# Patient Record
Sex: Male | Born: 1950 | Race: White | Hispanic: No | Marital: Married
Health system: Southern US, Community
[De-identification: ages and names within clinical notes are randomized; demographics above are authoritative.]

---

## 2005-08-24 ENCOUNTER — Ambulatory Visit: Payer: Self-pay | Admitting: Cardiology

## 2006-01-31 ENCOUNTER — Ambulatory Visit: Payer: Self-pay | Admitting: Unknown Physician Specialty

## 2008-03-31 ENCOUNTER — Ambulatory Visit: Payer: Self-pay | Admitting: Internal Medicine

## 2008-05-07 ENCOUNTER — Ambulatory Visit: Payer: Self-pay | Admitting: Internal Medicine

## 2008-09-10 ENCOUNTER — Ambulatory Visit: Payer: Self-pay | Admitting: Unknown Physician Specialty

## 2008-09-30 ENCOUNTER — Emergency Department: Payer: Self-pay | Admitting: Internal Medicine

## 2008-10-28 ENCOUNTER — Encounter: Payer: Self-pay | Admitting: Neurosurgery

## 2008-11-19 ENCOUNTER — Encounter: Payer: Self-pay | Admitting: Neurosurgery

## 2012-05-02 ENCOUNTER — Ambulatory Visit: Payer: Self-pay | Admitting: Radiation Oncology

## 2012-05-21 ENCOUNTER — Ambulatory Visit: Payer: Self-pay | Admitting: Radiation Oncology

## 2012-07-20 ENCOUNTER — Emergency Department: Payer: Self-pay | Admitting: Emergency Medicine

## 2013-08-25 IMAGING — CR DG RIBS 2V*L*
1 series · 4 of 4 positions shown · non-contrast
Comparison: none

REASON FOR EXAM: left sided rib pain
COMMENTS:

[Series 1: w ribs ap upper left · 0.14mm/px · 4 of 4 slices shown]
[im 1/4]
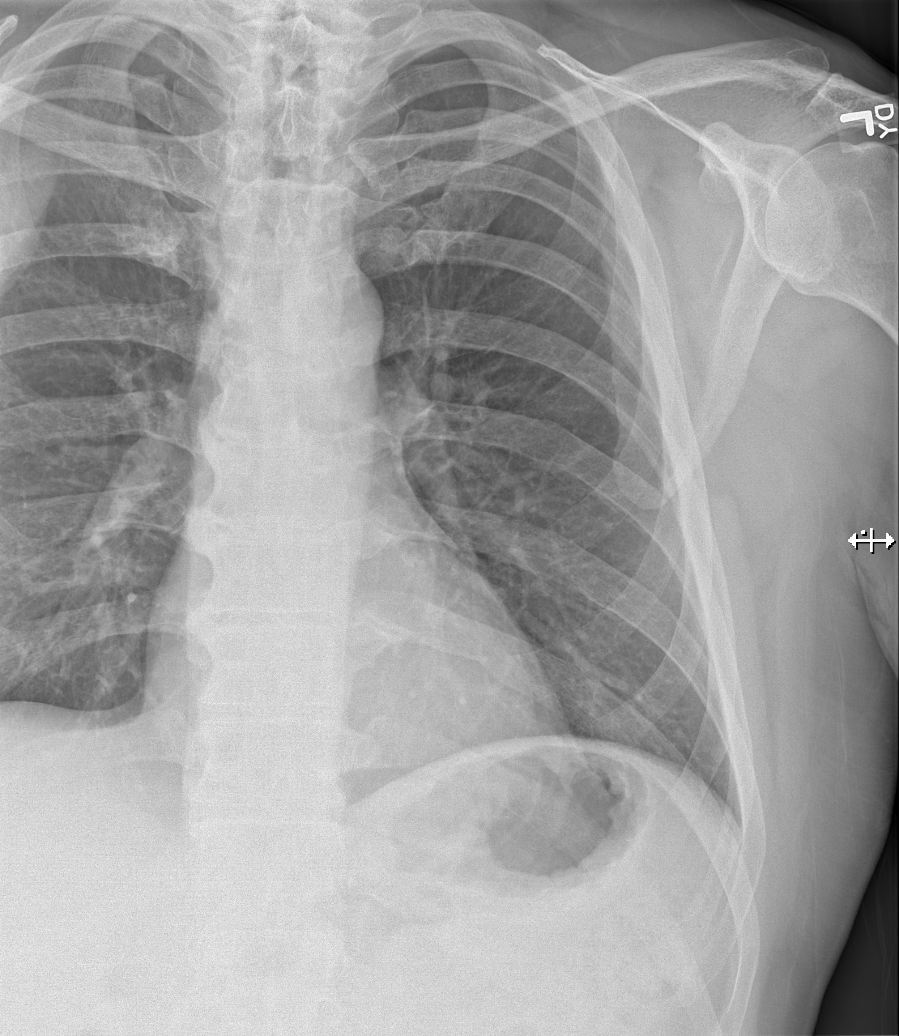
[im 2/4]
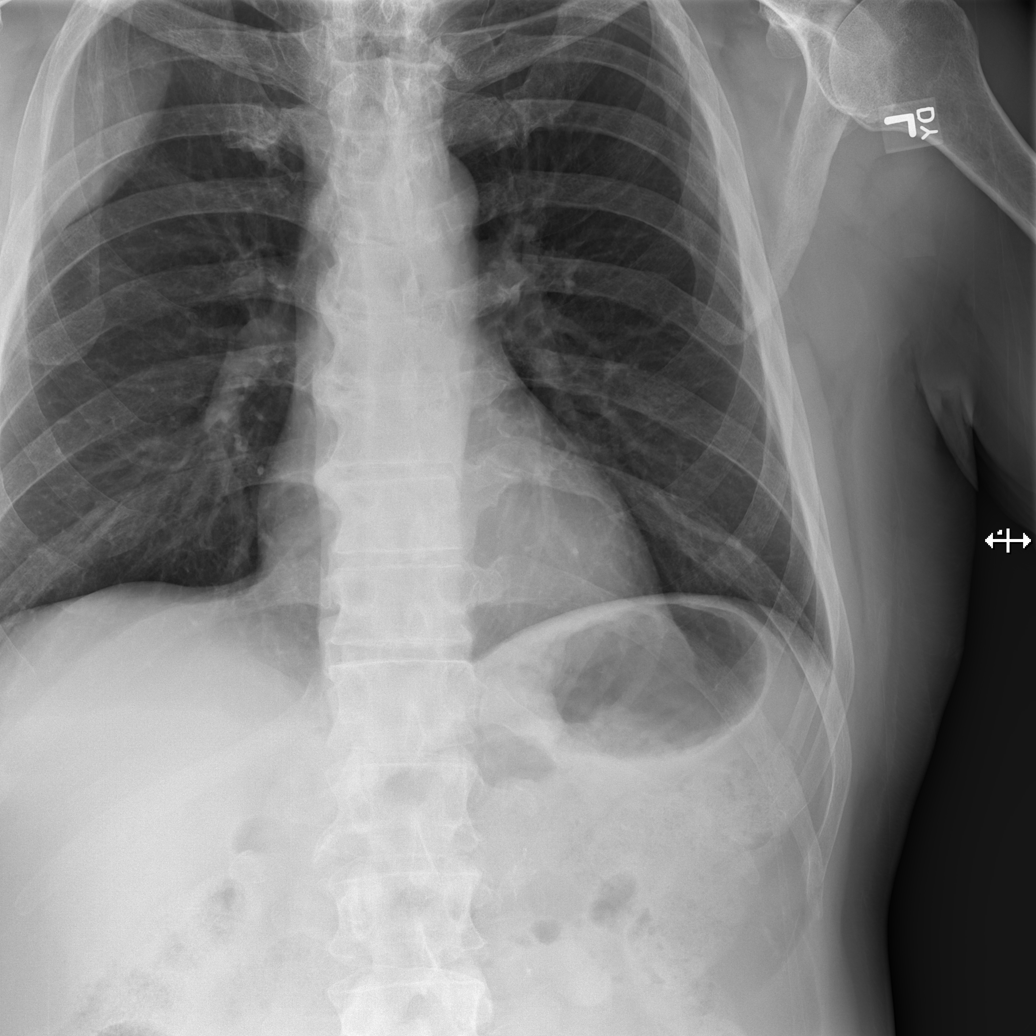
[im 3/4]
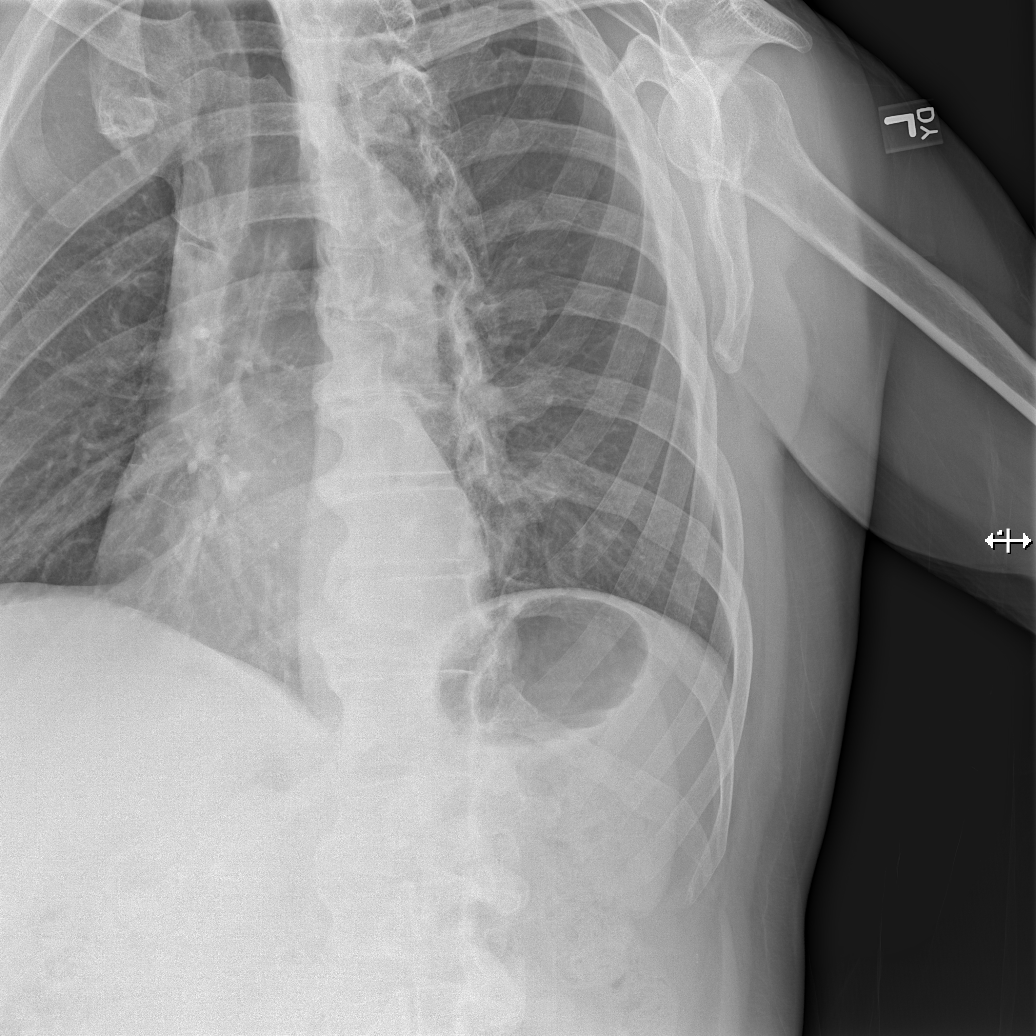
[im 4/4]
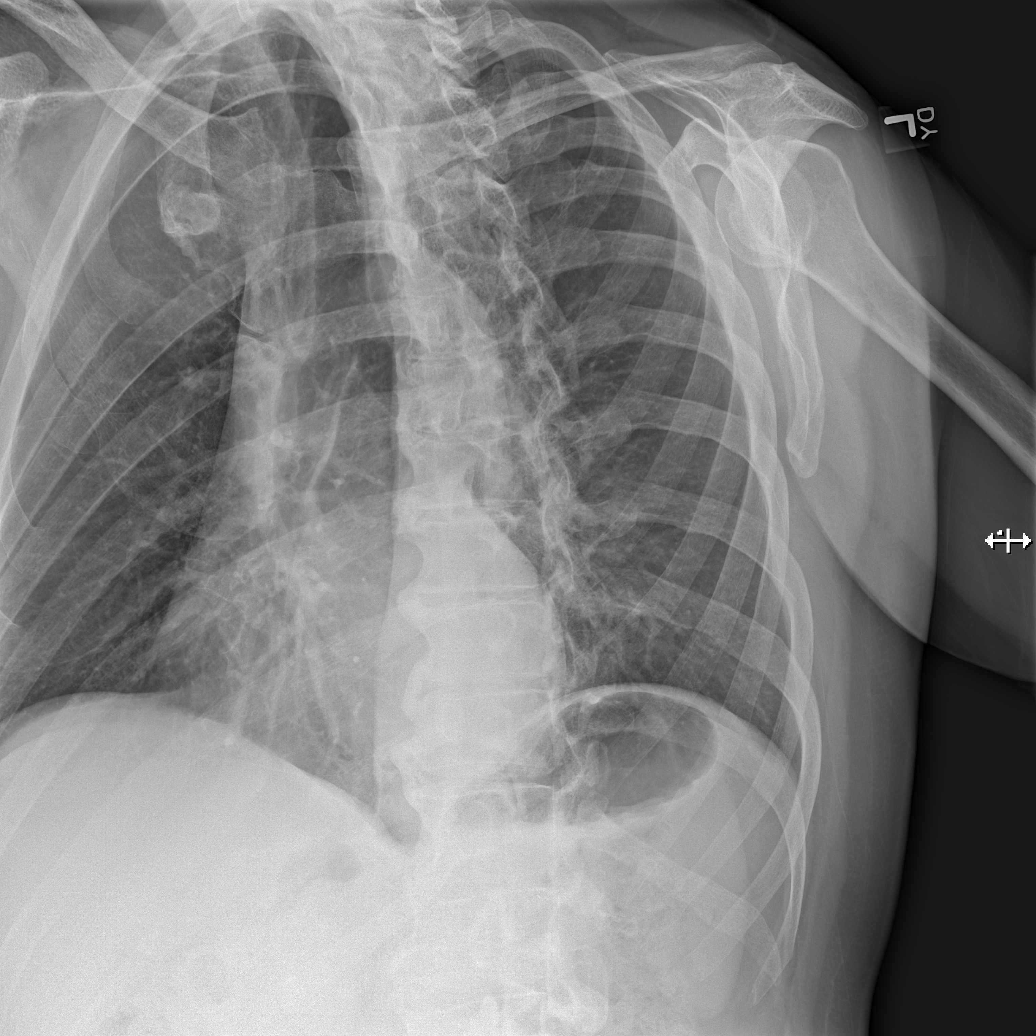

[4 of 4 positions shown; findings below may reference images not displayed]

PROCEDURE:     DXR - DXR RIBS LEFT UNILATERAL  - July 20, 2012 [DATE]

RESULT:     Four views of the left rib cage reveal the bones to be
adequately mineralized for age. There is no evidence of an acute displaced
left rib fracture. There is subtle contour deformity of the posterior aspect
of the left eighth rib, but a definite fracture line is not demonstrated.
There is no pleural effusion nor alveolar infiltrate. The patient has a
known destructive process associated with the posterior aspect of the right
fifth rib which is partially included in the field-of-view.
IMPRESSION: There is no definite evidence of an acute displaced rib
fracture on the left.

[REDACTED]

## 2013-08-25 IMAGING — CR DG CHEST 2V
1 series · 2 of 2 positions shown · non-contrast
Comparison: none

REASON FOR EXAM: chest pain, trauma
COMMENTS:

[Series 1: w chest pa · 0.14mm/px · 2 of 2 slices shown]
[im 1/2]
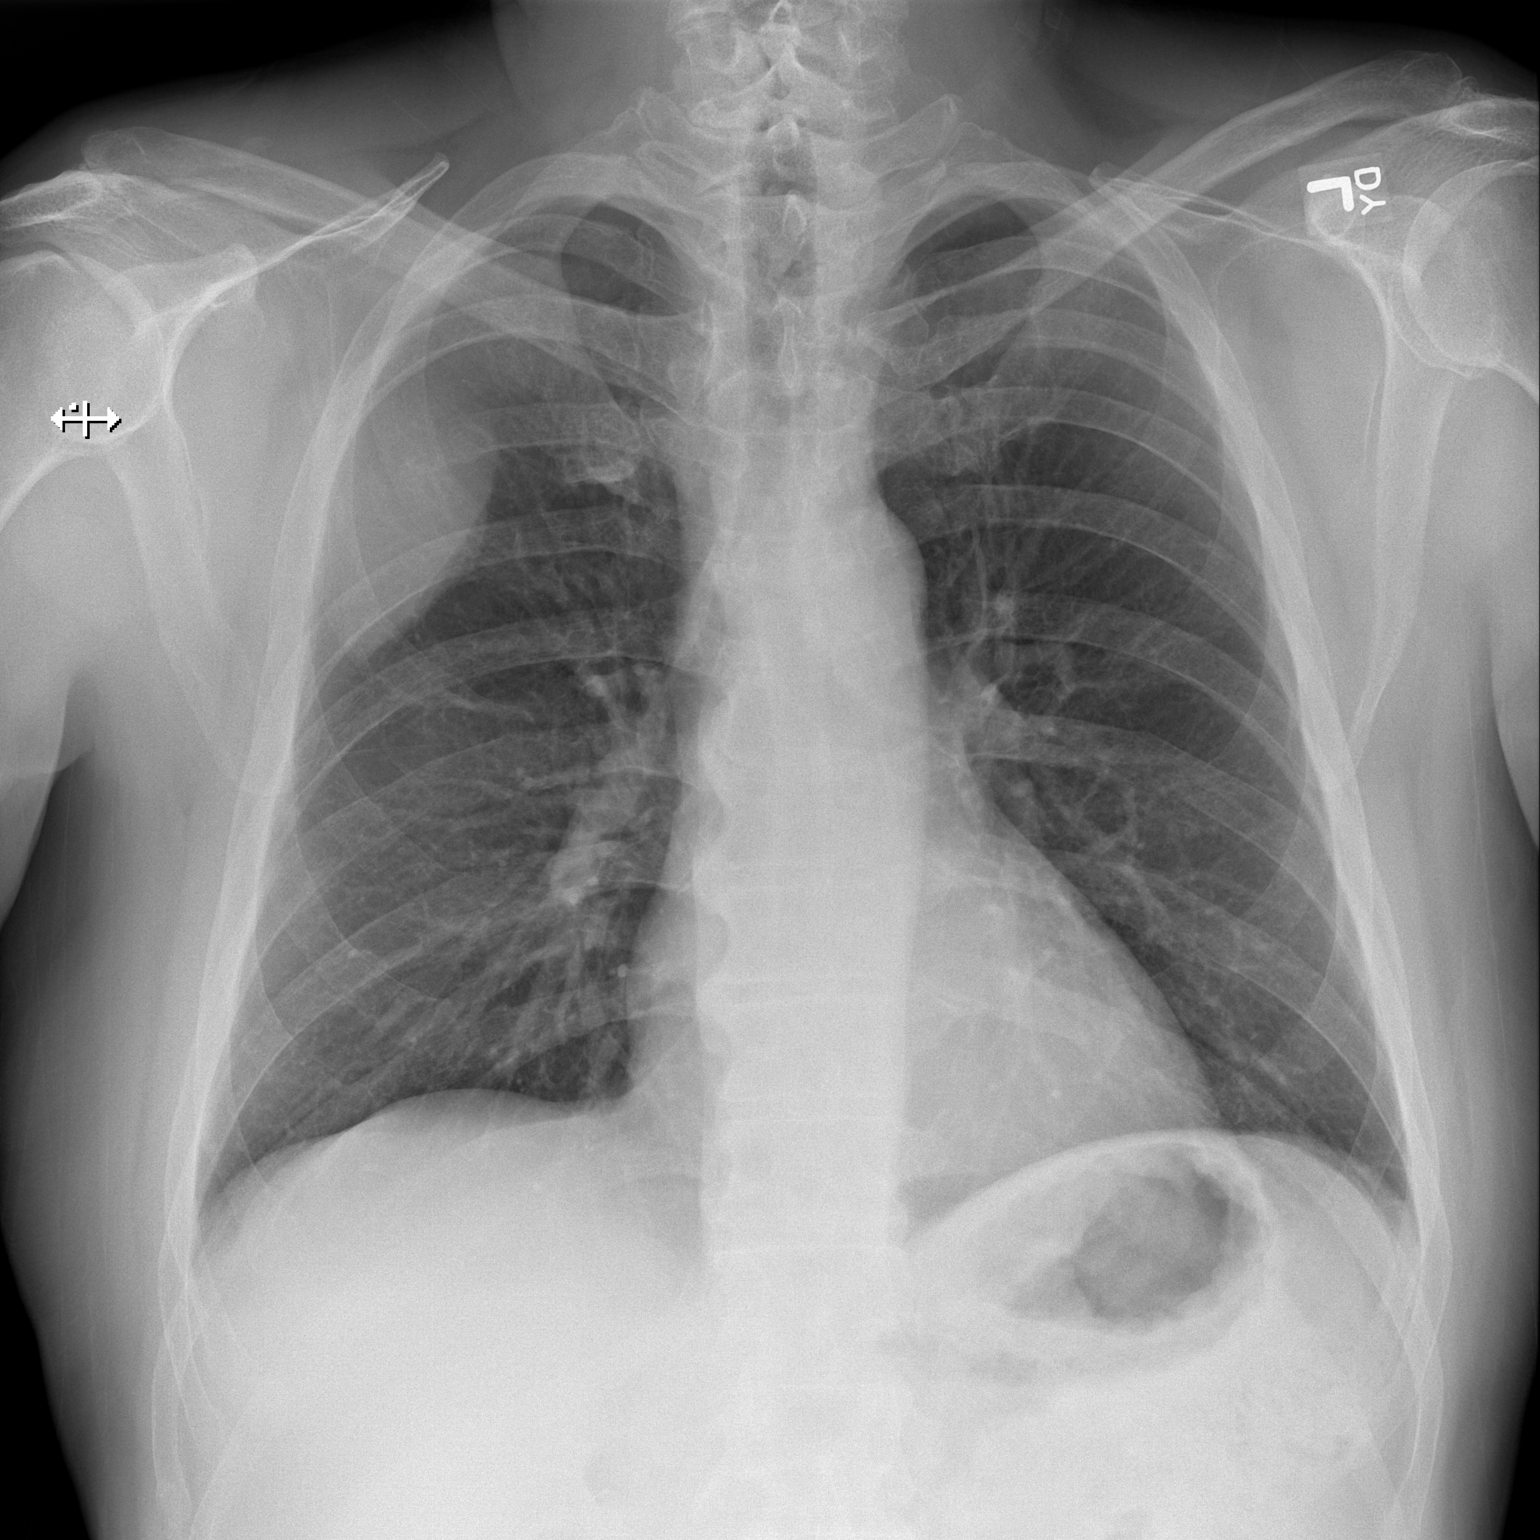
[im 2/2]
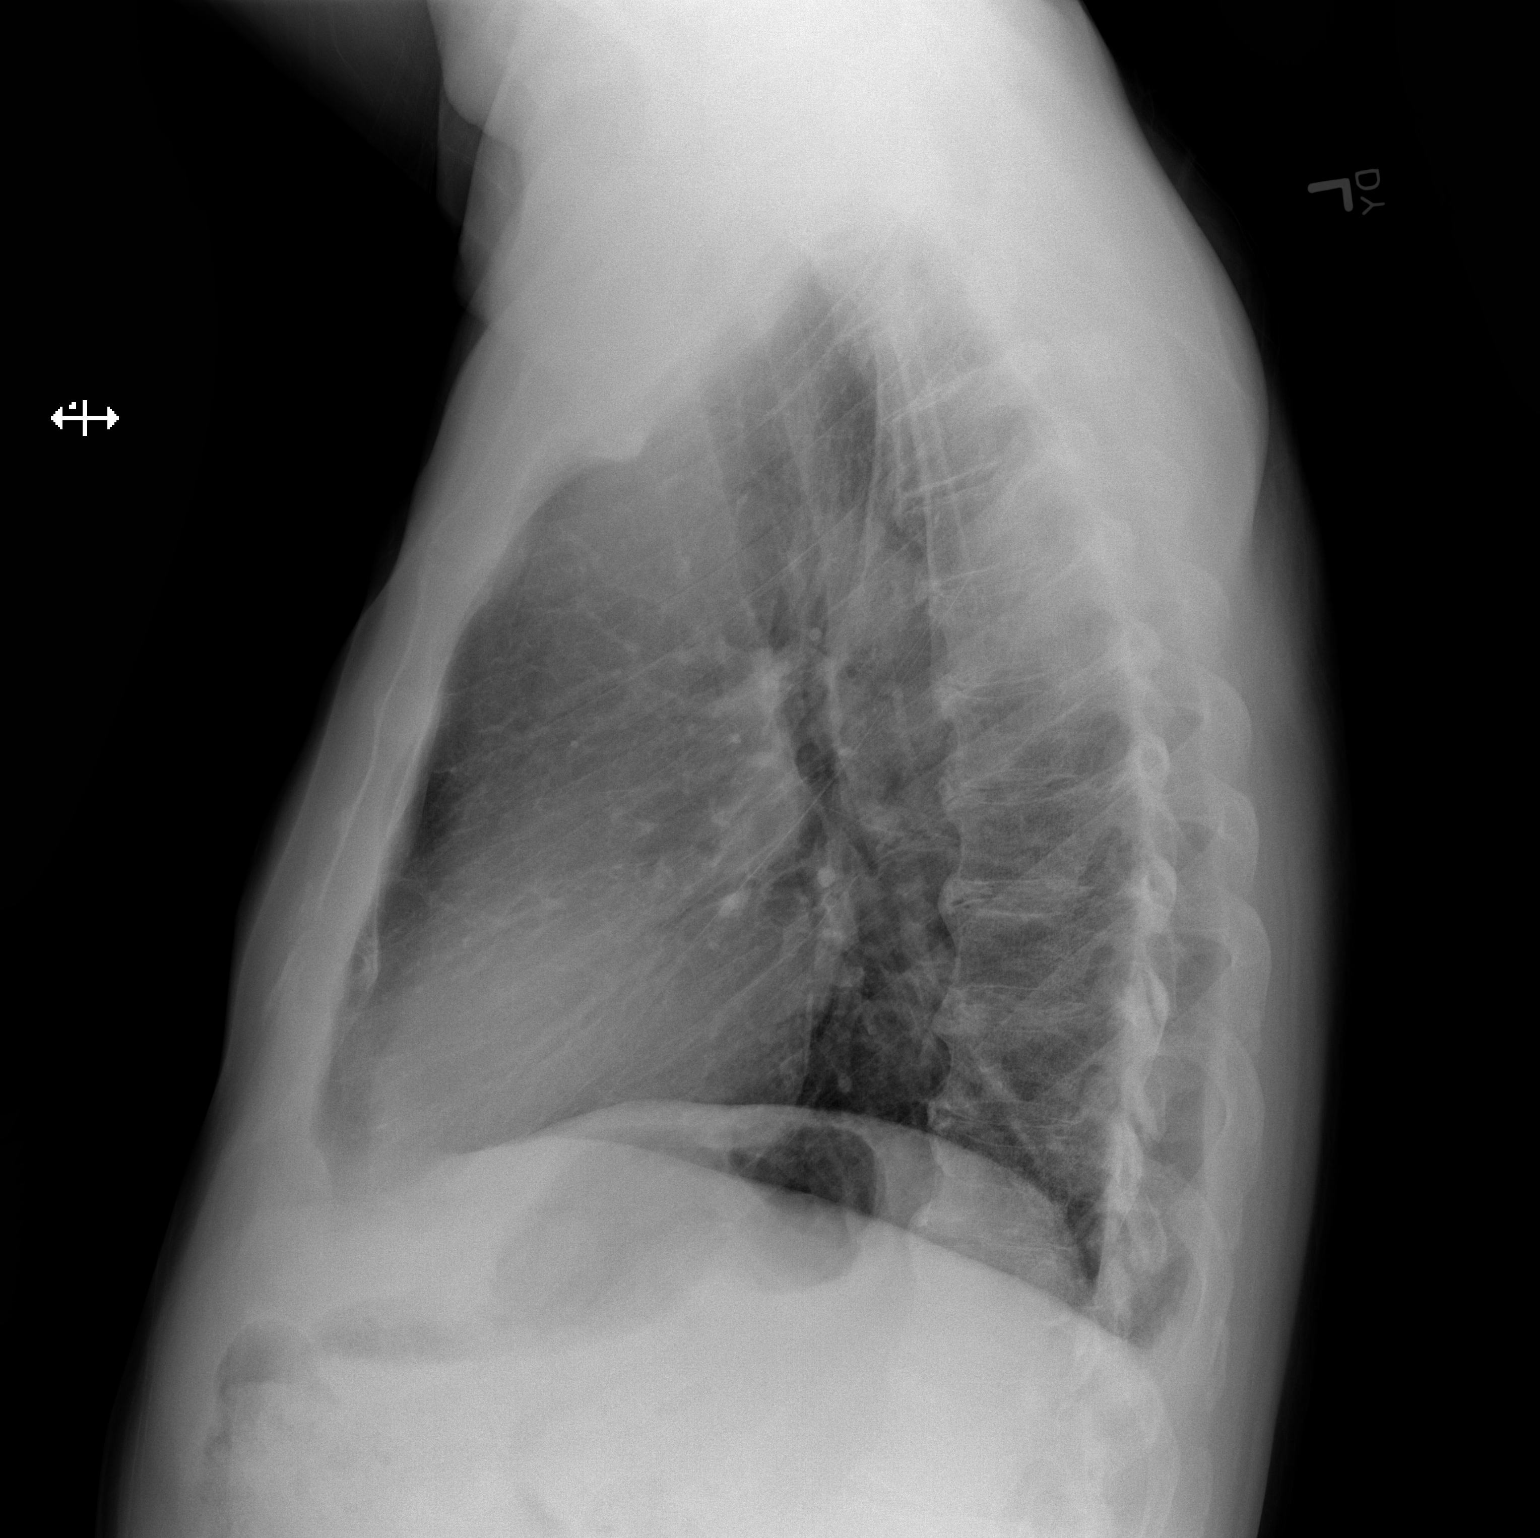

[2 of 2 positions shown; findings below may reference images not displayed]

PROCEDURE:     DXR - DXR CHEST PA (OR AP) AND LATERAL  - July 20, 2012 [DATE]

RESULT:     The lungs are well-expanded. There is a pleural-based soft
tissue density posterolaterally in the right upper hemithorax. There is
destruction of the lateral aspect of the right fifth rib. The mediastinum is
not shifted and is normal in width. The cardiac silhouette is normal in
size. There is no pleural effusion or pneumothorax or pneumomediastinum.
IMPRESSION: 1. There is no evidence of acute posttraumatic injury.
2. There is a mass in the right upper hemithorax posterolaterally.
Destruction of the postero lateral aspect of the right fifth rib is present.

The findings were discussed by me by telephone with Dr. Ptychoverpa at the
conclusion of the study. The patient has a known history of metastatic renal
cell malignancy.

[REDACTED]

## 2014-01-14 ENCOUNTER — Ambulatory Visit: Payer: Self-pay | Admitting: Nephrology

## 2014-12-08 NOTE — Consult Note (Signed)
Reason for Visit: This 64 year old Male patient presents to the clinic for initial evaluation of  Stage IV renal cell carcinoma .   Referred by Dr. Lyndon Code [facility] radiation oncology.  Diagnosis:   Chief Complaint/Diagnosis   64 year old male with stage IV renal cell carcinoma 5 cm lesion in the posterior right 5th rib asymptomatic at this time   Pathology Report Pathology report reviewed    Imaging Report Imaging records requested from [facility]    Referral Report Clinical notes reviewed    Planned Treatment Regimen Evaluation by medical oncology and possible palliative radiation therapy to rib    HPI   patient is a 64 year old male who presented with mental status alterations and was found to have a right lateral ventricle mass status post neurosurgical resection February 2010 for renal cell carcinoma. CT scan in March of 2000 tends showed 2 left adrenal masses. 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 Underwent left sided laparoscopic guided radical nephrectomy April 2010. Repeat followup CT scans back in August of 2013 showed a 5 cm lesion arising from the posterior lateral right 5th rib. This is asymptomatic. He is been read recommended to have radiotherapy on this area and is seen today for consideration of treatment. From a neurologic standpoint he is functionally well no significant impairment. He has not been tried on any targeted therapies at this time. He otherwise specifically denies any other areas of bony pain.fine-needle aspiration of his rib lesion was positive for metastatic renal cell carcinoma.  Past Hx:    Renal Cell Cancer:    Brain Met:    diabetes:    htn:    Left Nephrectomy:    back surgery:   Past, Family and Social History:   Past Medical History positive    Cardiovascular hyperlipidemia; hypertension    Endocrine diabetes mellitus    Past Surgical History Laparoscopic nephrectomy, neurosurgical  resection of brain metastasis, back surgery,    Family History noncontributory    Social History noncontributory    Additional Past Medical and Surgical History Accompanied by wife today   Allergies:   Codeine: GI Distress  Gabapentin: Rash  Home Meds:  Home Medications: Medication Instructions Status  Humalog 100 units/mL subcutaneous solution 30 unit(s) subcutaneous 3 times a day Active  Lantus 100 units/mL subcutaneous solution 60 unit(s) subcutaneous once a day (at bedtime) Active  verapamil 240 mg/24 hours oral capsule, extended release 1 cap(s) orally once a day Active  Zocor 20 mg oral tablet 1 tab(s) orally once a day (at bedtime) Active  aspirin 81 mg oral tablet 1 tab(s) orally once a day Active  cinnamon 500 mg oral capsule 2 cap(s) orally 2 times a day Active  Magnesium 400 mg.  1 tab(s) orally once a day Active  folic acid 0.4 mg oral tablet 1 tab(s) orally once a day Active  Fish Oil 1000 mg oral capsule 1 cap(s) orally 2 times a day Active  Cranberry 500 mg.  1 cap(s) orally once a day Active   Review of Systems:   General negative    Performance Status (ECOG) 0    Breast negative    Ophthalmologic negative    ENMT negative    Respiratory and Thorax negative    Cardiovascular negative    Gastrointestinal negative    Genitourinary see HPI    Musculoskeletal negative    Neurological see HPI    Psychiatric negative    Hematology/Lymphatics negative  Endocrine negative    Review of Systems   aside from prior removal of skin cancerPatient denies any weight loss, fatigue, weakness, fever, chills or night sweats. Patient denies any loss of vision, blurred vision. Patient denies any ringing  of the ears or hearing loss. No irregular heartbeat. Patient denies heart murmur or history of fainting. Patient denies any chest pain or pain radiating to her upper extremities. Patient denies any shortness of breath, difficulty breathing at night, cough or  hemoptysis. Patient denies any swelling in the lower legs. Patient denies any nausea vomiting, vomiting of blood, or coffee ground material in the vomitus. Patient denies any stomach pain. Patient states has had normal bowel movements no significant constipation or diarrhea. Patient denies any dysuria, hematuria or significant nocturia. Patient denies any problems walking, swelling in the joints or loss of balance. Patient denies any skin changes, loss of hair or loss of weight. Patient denies any excessive worrying or anxiety or significant depression. Patient denies any problems with insomnia. Patient denies excessive thirst, polyuria, polydipsia. Patient denies any swollen glands, patient denies easy bruising or easy bleeding. Patient denies any recent infections, allergies or URI. Patient "s visual fields have not changed significantly in recent time.  Nursing Notes:  Nursing Vital Signs and Chemo Nursing Nursing Notes: *CC Vital Signs Flowsheet:   12-Sep-13 08:18   Temp Temperature 97.8   Pulse Pulse 91   Respirations Respirations 20   SBP SBP 130   DBP DBP 71   Pain Scale (0-10)  0   Current Weight (kg) (kg) 85.9   Height (cm) centimeters 187.8   BSA (m2) 2.1   Physical Exam:  General/Skin/HEENT:   General normal    Skin normal    Eyes normal    ENMT normal    Head and Neck normal    Additional PE Well-developed male in NAD. Cranial nerves II through XII are grossly intact. Crude visual fields are within normal range. Motor sensory N. DTR levels are equal and symmetric in the upper and lower extremities. No pain is elicited on palpation of his rib cage bilaterally. Abdomen is benign. Lungs are clear to A&P cardiac examination shows regular rate and rhythm.   Breasts/Resp/CV/GI/GU:   Respiratory and Thorax normal    Cardiovascular normal    Gastrointestinal normal    Genitourinary normal   MS/Neuro/Psych/Lymph:   Musculoskeletal normal    Neurological normal     Lymphatics normal   Assessment and Plan:  Impression:   stage IV renal cell carcinoma with progressive disease in right 5th rib on patient is a 90 if any targeted therapies or chemotherapy intervention.  Plan:   this time I discussed the case with medical oncology. We will leave patient may be a candidate for molecular targeted therapy with eitherVEGF or mTOR inhibitor. This can be used and observe his rib lesion is measurable disease for treatment response. We will have him evaluated by medical oncology early next week for consideration of such therapy. Should he not be a candidate for such therapy would go ahead with palliative radiation therapy to the rib lesion. Would need to review all of his recurrent CT scans for tumor delineation and I have requested them to be forwarded to [place] from [facility] radiology Department. Brief description of risks and benefits of treatment were provided to the patient and his wife. Appointment with medical oncology was made.  I would like to take this opportunity to thank you for allowing me to continue to  participate in this patient's care.  CC Referral:   cc: Dr. Arrie Senate Department of radiation oncology, Dr. Apolonio Schneiders, Dr. Frances Furbish [facility] Department of urology   Electronic Signatures: Baruch Gouty, Roda Shutters (MD)  (Signed 12-Sep-13 15:10)  Authored: HPI, Diagnosis, Past Hx, PFSH, Allergies, Home Meds, ROS, Nursing Notes, Physical Exam, Encounter Assessment and Plan, CC Referring Physician   Last Updated: 12-Sep-13 15:10 by Armstead Peaks (MD)

## 2015-02-19 DEATH — deceased
